# Patient Record
Sex: Male | Born: 1995 | Race: Black or African American | Hispanic: No | Marital: Single | Smoking: Never smoker
Health system: Southern US, Community
[De-identification: ages and names within clinical notes are randomized; demographics above are authoritative.]

---

## 2017-12-08 ENCOUNTER — Emergency Department (HOSPITAL_COMMUNITY)
Admission: EM | Admit: 2017-12-08 | Discharge: 2017-12-09 | Disposition: A | Discharge status: Home / Self Care | Payer: BLUE CROSS/BLUE SHIELD | Attending: Emergency Medicine | Admitting: Emergency Medicine

## 2017-12-08 ENCOUNTER — Encounter (HOSPITAL_COMMUNITY): Payer: Self-pay | Admitting: Emergency Medicine

## 2017-12-08 DIAGNOSIS — K2900 Acute gastritis without bleeding: Secondary | ICD-10-CM | POA: Insufficient documentation

## 2017-12-08 DIAGNOSIS — R112 Nausea with vomiting, unspecified: Secondary | ICD-10-CM | POA: Diagnosis present

## 2017-12-08 LAB — I-STAT CHEM 8, ED
BUN: 15 mg/dL (ref 6–20)
CREATININE: 1.2 mg/dL (ref 0.61–1.24)
Calcium, Ion: 1.14 mmol/L — ABNORMAL LOW (ref 1.15–1.40)
Chloride: 105 mmol/L (ref 98–111)
Glucose, Bld: 75 mg/dL (ref 70–99)
HCT: 45 % (ref 39.0–52.0)
HEMOGLOBIN: 15.3 g/dL (ref 13.0–17.0)
POTASSIUM: 4.3 mmol/L (ref 3.5–5.1)
Sodium: 141 mmol/L (ref 135–145)
TCO2: 25 mmol/L (ref 22–32)

## 2017-12-08 LAB — CBC WITH DIFFERENTIAL/PLATELET
ABS IMMATURE GRANULOCYTES: 0.02 10*3/uL (ref 0.00–0.07)
Basophils Absolute: 0 10*3/uL (ref 0.0–0.1)
Basophils Relative: 0 %
EOS ABS: 0 10*3/uL (ref 0.0–0.5)
Eosinophils Relative: 0 %
HEMATOCRIT: 50.5 % (ref 39.0–52.0)
Hemoglobin: 15.8 g/dL (ref 13.0–17.0)
IMMATURE GRANULOCYTES: 0 %
LYMPHS ABS: 0.7 10*3/uL (ref 0.7–4.0)
Lymphocytes Relative: 7 %
MCH: 29.3 pg (ref 26.0–34.0)
MCHC: 31.3 g/dL (ref 30.0–36.0)
MCV: 93.5 fL (ref 80.0–100.0)
MONOS PCT: 4 %
Monocytes Absolute: 0.5 10*3/uL (ref 0.1–1.0)
NEUTROS PCT: 89 %
Neutro Abs: 10.1 10*3/uL — ABNORMAL HIGH (ref 1.7–7.7)
Platelets: 357 10*3/uL (ref 150–400)
RBC: 5.4 MIL/uL (ref 4.22–5.81)
RDW: 14 % (ref 11.5–15.5)
WBC: 11.3 10*3/uL — ABNORMAL HIGH (ref 4.0–10.5)
nRBC: 0 % (ref 0.0–0.2)

## 2017-12-08 LAB — URINALYSIS, ROUTINE W REFLEX MICROSCOPIC
BILIRUBIN URINE: NEGATIVE
Glucose, UA: NEGATIVE mg/dL
KETONES UR: 80 mg/dL — AB
LEUKOCYTES UA: NEGATIVE
Nitrite: NEGATIVE
PH: 5 (ref 5.0–8.0)
PROTEIN: NEGATIVE mg/dL
Specific Gravity, Urine: 1.024 (ref 1.005–1.030)

## 2017-12-08 LAB — COMPREHENSIVE METABOLIC PANEL
ALK PHOS: 95 U/L (ref 38–126)
ALT: 36 U/L (ref 0–44)
AST: 49 U/L — AB (ref 15–41)
Albumin: 5 g/dL (ref 3.5–5.0)
Anion gap: 13 (ref 5–15)
BILIRUBIN TOTAL: 1.6 mg/dL — AB (ref 0.3–1.2)
BUN: 14 mg/dL (ref 6–20)
CHLORIDE: 105 mmol/L (ref 98–111)
CO2: 21 mmol/L — AB (ref 22–32)
Calcium: 10.1 mg/dL (ref 8.9–10.3)
Creatinine, Ser: 1.27 mg/dL — ABNORMAL HIGH (ref 0.61–1.24)
GFR calc Af Amer: >60 mL/min (ref >60)
GFR calc non Af Amer: >60 mL/min (ref >60)
GLUCOSE: 77 mg/dL (ref 70–99)
Potassium: 5.2 mmol/L — ABNORMAL HIGH (ref 3.5–5.1)
Sodium: 139 mmol/L (ref 135–145)
TOTAL PROTEIN: 8.1 g/dL (ref 6.5–8.1)

## 2017-12-08 MED ORDER — ALUM & MAG HYDROXIDE-SIMETH 200-200-20 MG/5ML PO SUSP
30.0000 mL | Freq: Once | ORAL | Status: AC
  Administered 2017-12-08: 30 mL via ORAL
  Filled 2017-12-08: qty 30

## 2017-12-08 MED ORDER — SODIUM CHLORIDE 0.9 % IV SOLN
INTRAVENOUS | Status: AC
  Administered 2017-12-08: 17:00:00 via INTRAVENOUS

## 2017-12-08 MED ORDER — SODIUM CHLORIDE 0.9 % IV BOLUS
1000.0000 mL | Freq: Once | INTRAVENOUS | Status: AC
  Administered 2017-12-08: 1000 mL via INTRAVENOUS

## 2017-12-08 MED ORDER — ONDANSETRON HCL 4 MG/2ML IJ SOLN
4.0000 mg | Freq: Once | INTRAMUSCULAR | Status: AC
  Administered 2017-12-08: 4 mg via INTRAVENOUS
  Filled 2017-12-08: qty 2

## 2017-12-08 MED ORDER — LIDOCAINE VISCOUS HCL 2 % MT SOLN
15.0000 mL | Freq: Once | OROMUCOSAL | Status: AC
  Administered 2017-12-08: 15 mL via ORAL
  Filled 2017-12-08: qty 15

## 2017-12-08 NOTE — ED Provider Notes (Signed)
Patient placed in Quick Look pathway, seen and evaluated   Chief Complaint: nausea and vomiting, weak  HPI: Dustin Sweeney is a 22 y.o. male who presents to the ED with n/v that started this morning. Patient reports vomiting more than 20 times today. He c/o feeling weak.   ROS: General: weakness  GI: n/v  Physical Exam:  BP (!) 142/125   Pulse 67   Temp 97.6 F (36.4 C) (Oral)   Resp 16   SpO2 100%    Gen: No distress  Neuro: Awake and Alert  Skin: Warm and dry   Initiation of care has begun. The patient has been counseled on the process, plan, and necessity for staying for the completion/evaluation, and the remainder of the medical screening examination    Janne Napoleon, NP 12/08/17 1657    Raeford Razor, MD 12/09/17 [phone]

## 2017-12-08 NOTE — ED Triage Notes (Signed)
Pt presents to ED for assessment of nausea, emesis and abdominal discomfort since this morning.  States the last thing he at was [employer] last night.  Denies known exposure

## 2017-12-08 NOTE — ED Provider Notes (Signed)
[facility] EMERGENCY DEPARTMENT Provider Note   CSN: [id number] Arrival date & time: 12/08/17  1640     History   Chief Complaint Chief Complaint  Patient presents with  . Nausea  . Emesis    HPI Lavonte Palos is a 22 y.o. male.  Patient is a 22 year old male with no significant past medical history who presents with nausea and vomiting.  He states he drank a fair amount of alcohol last night and then ate a 12 count chicken nuggets from [employer].  This morning he had onset of nausea and vomiting.  Initially thought it was just a hangover but continued to vomit throughout the day.  He has some generalized abdominal soreness.  No fevers.  No diarrhea.  No hematemesis.  Since he was seen in triage, he has been given 1 L IV fluid and Zofran IV.  He states he is feeling much better and request something to drink.     History reviewed. No pertinent past medical history.  There are no active problems to display for this patient.   History reviewed. No pertinent surgical history.      Home Medications    Prior to Admission medications   Medication Sig Start Date End Date Taking? Authorizing Provider  ondansetron (ZOFRAN ODT) 4 MG disintegrating tablet 4mg  ODT q4 hours prn nausea/vomit 12/09/17   Rolan Bucco, MD    Family History History reviewed. No pertinent family history.  Social History Social History   Tobacco Use  . Smoking status: Never Smoker  . Smokeless tobacco: Never Used  Substance Use Topics  . Alcohol use: Not Currently  . Drug use: Never     Allergies   Patient has no allergy information on record.   Review of Systems Review of Systems  Constitutional: Positive for fatigue. Negative for chills, diaphoresis and fever.  HENT: Negative for congestion, rhinorrhea and sneezing.   Eyes: Negative.   Respiratory: Negative for cough, chest tightness and shortness of breath.   Cardiovascular: Negative for chest pain and leg  swelling.  Gastrointestinal: Positive for nausea and vomiting. Negative for abdominal pain, blood in stool and diarrhea.  Genitourinary: Negative for difficulty urinating, flank pain, frequency and hematuria.  Musculoskeletal: Negative for arthralgias and back pain.  Skin: Negative for rash.  Neurological: Negative for dizziness, speech difficulty, weakness, numbness and headaches.     Physical Exam Updated Vital Signs BP (!) 125/52 (BP Location: Right Arm)   Pulse 60   Temp 97.6 F (36.4 C) (Oral)   Resp 16   SpO2 98%   Physical Exam  Constitutional: He is oriented to person, place, and time. He appears well-developed and well-nourished.  HENT:  Head: Normocephalic and atraumatic.  Eyes: Pupils are equal, round, and reactive to light.  Neck: Normal range of motion. Neck supple.  Cardiovascular: Normal rate, regular rhythm and normal heart sounds.  Pulmonary/Chest: Effort normal and breath sounds normal. No respiratory distress. He has no wheezes. He has no rales. He exhibits no tenderness.  Abdominal: Soft. Bowel sounds are normal. There is no tenderness. There is no rebound and no guarding.  Musculoskeletal: Normal range of motion. He exhibits no edema.  Lymphadenopathy:    He has no cervical adenopathy.  Neurological: He is alert and oriented to person, place, and time.  Skin: Skin is warm and dry. No rash noted.  Psychiatric: He has a normal mood and affect.     ED Treatments / Results  Labs (all labs ordered are  listed, but only abnormal results are displayed) Labs Reviewed  CBC WITH DIFFERENTIAL/PLATELET - Abnormal; Notable for the following components:      Result Value   WBC 11.3 (*)    Neutro Abs 10.1 (*)    All other components within normal limits  COMPREHENSIVE METABOLIC PANEL - Abnormal; Notable for the following components:   Potassium 5.2 (*)    CO2 21 (*)    Creatinine, Ser 1.27 (*)    AST 49 (*)    Total Bilirubin 1.6 (*)    All other components  within normal limits  URINALYSIS, ROUTINE W REFLEX MICROSCOPIC - Abnormal; Notable for the following components:   Hgb urine dipstick SMALL (*)    Ketones, ur 80 (*)    Bacteria, UA RARE (*)    All other components within normal limits  I-STAT CHEM 8, ED - Abnormal; Notable for the following components:   Calcium, Ion 1.14 (*)    All other components within normal limits    EKG None  Radiology No results found.  Procedures Procedures (including critical care time)  Medications Ordered in ED Medications  0.9 %  sodium chloride infusion ( Intravenous Stopped 12/08/17 1837)  sodium chloride 0.9 % bolus 1,000 mL (1,000 mLs Intravenous New Bag/Given 12/08/17 2345)  ondansetron (ZOFRAN) injection 4 mg (4 mg Intravenous Given 12/08/17 1511)  alum & mag hydroxide-simeth (MAALOX/MYLANTA) 200-200-20 MG/5ML suspension 30 mL (30 mLs Oral Given 12/08/17 2341)    And  lidocaine (XYLOCAINE) 2 % viscous mouth solution 15 mL (15 mLs Oral Given 12/08/17 2341)     Initial Impression / Assessment and Plan / ED Course  I have reviewed the triage vital signs and the nursing notes.  Pertinent labs & imaging results that were available during my care of the patient were reviewed by me and considered in my medical decision making (see chart for details).     Patient is a 22 year old male who presents with nausea and vomiting.  His abdominal exam is completely benign.  He was given IV fluids and antiemetics.  He is feeling much better and is tolerating oral fluids without any nausea or vomiting.  His labs are non-concerning.  His initial labs showed some signs of dehydration with an elevated creatinine and mildly elevated potassium.  Recheck on this showed that his labs have normalized.  This was after IV fluids.  He did complain of a sore throat which I feel is likely from the vomiting.  This resolved after GI cocktail.  He was discharged home in good condition.  Return precautions were given.  Final  Clinical Impressions(s) / ED Diagnoses   Final diagnoses:  Acute gastritis without hemorrhage, unspecified gastritis type  Non-intractable vomiting with nausea, unspecified vomiting type    ED Discharge Orders         Ordered    ondansetron (ZOFRAN ODT) 4 MG disintegrating tablet     12/09/17 0015           Rolan Bucco, MD 12/09/17 6962

## 2017-12-09 MED ORDER — ONDANSETRON 4 MG PO TBDP: ORAL_TABLET | ORAL | 0 refills | Status: AC

## 2018-10-01 ENCOUNTER — Other Ambulatory Visit: Payer: Self-pay

## 2018-10-01 DIAGNOSIS — Z20822 Contact with and (suspected) exposure to covid-19: Secondary | ICD-10-CM

## 2018-10-02 LAB — NOVEL CORONAVIRUS, NAA: SARS-CoV-2, NAA: NOT DETECTED

## 2018-10-02 LAB — SPECIMEN STATUS REPORT

## 2018-11-08 ENCOUNTER — Encounter (HOSPITAL_COMMUNITY): Payer: Self-pay | Admitting: Emergency Medicine

## 2018-11-08 ENCOUNTER — Emergency Department (HOSPITAL_COMMUNITY): Payer: Self-pay

## 2018-11-08 ENCOUNTER — Emergency Department (HOSPITAL_COMMUNITY)
Admission: EM | Admit: 2018-11-08 | Discharge: 2018-11-08 | Disposition: A | Discharge status: Home / Self Care | Payer: Self-pay | Attending: Emergency Medicine | Admitting: Emergency Medicine

## 2018-11-08 DIAGNOSIS — Y9302 Activity, running: Secondary | ICD-10-CM | POA: Insufficient documentation

## 2018-11-08 DIAGNOSIS — W540XXA Bitten by dog, initial encounter: Secondary | ICD-10-CM | POA: Insufficient documentation

## 2018-11-08 DIAGNOSIS — Y999 Unspecified external cause status: Secondary | ICD-10-CM | POA: Insufficient documentation

## 2018-11-08 DIAGNOSIS — S21132A Puncture wound without foreign body of left front wall of thorax without penetration into thoracic cavity, initial encounter: Secondary | ICD-10-CM | POA: Insufficient documentation

## 2018-11-08 DIAGNOSIS — Y929 Unspecified place or not applicable: Secondary | ICD-10-CM | POA: Insufficient documentation

## 2018-11-08 DIAGNOSIS — Z23 Encounter for immunization: Secondary | ICD-10-CM | POA: Insufficient documentation

## 2018-11-08 DIAGNOSIS — S81831A Puncture wound without foreign body, right lower leg, initial encounter: Secondary | ICD-10-CM | POA: Insufficient documentation

## 2018-11-08 MED ORDER — AMOXICILLIN-POT CLAVULANATE 875-125 MG PO TABS
1.0000 | ORAL_TABLET | Freq: Once | ORAL | Status: AC
  Administered 2018-11-08: 1 via ORAL
  Filled 2018-11-08: qty 1

## 2018-11-08 MED ORDER — TETANUS-DIPHTH-ACELL PERTUSSIS 5-2.5-18.5 LF-MCG/0.5 IM SUSP
0.5000 mL | Freq: Once | INTRAMUSCULAR | Status: AC
  Administered 2018-11-08: 17:00:00 0.5 mL via INTRAMUSCULAR
  Filled 2018-11-08: qty 0.5

## 2018-11-08 MED ORDER — AMOXICILLIN-POT CLAVULANATE 875-125 MG PO TABS: 1.0000 | ORAL_TABLET | Freq: Two times a day (BID) | ORAL | 0 refills | Status: AC

## 2018-11-08 NOTE — ED Notes (Signed)
Patient transported to X-ray 

## 2018-11-08 NOTE — ED Triage Notes (Signed)
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 Pt has puncture wound to right calf and abrasions to left rib area with hematoma.

## 2018-11-08 NOTE — ED Provider Notes (Signed)
[facility] EMERGENCY DEPARTMENT Provider Note   CSN: [id number] Arrival date & time: 11/08/18  1403     History   Chief Complaint Chief Complaint  Patient presents with  . Animal Bite    HPI Dustin Sweeney is a 23 y.o. male who is previously healthy who presents with dog bite to his right calf and left rib cage.  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  He first bit him in the left rib cage and then on the right shin.  He reports he washed the wounds with peroxide and applied bacitracin immediately after.  The owner of the dog stated that he is up-to-date on all his shots.  Patient does not believe he is up-to-date on his tetanus.  He denies any chest pain or shortness of breath, numbness tingling, or any other injuries.     HPI  History reviewed. No pertinent past medical history.  There are no active problems to display for this patient.   No past surgical history on file.      Home Medications    Prior to Admission medications   Medication Sig Start Date End Date Taking? Authorizing Provider  amoxicillin-clavulanate (AUGMENTIN) 875-125 MG tablet Take 1 tablet by mouth every 12 (twelve) hours. 11/08/18   Emi Holes, PA-C  ondansetron (ZOFRAN ODT) 4 MG disintegrating tablet 4mg  ODT q4 hours prn nausea/vomit 12/09/17   12/11/17, MD    Family History No family history on file.  Social History Social History   Tobacco Use  . Smoking status: Never Smoker  . Smokeless tobacco: Never Used  Substance Use Topics  . Alcohol use: Not Currently  . Drug use: Never     Allergies   Patient has no known allergies.   Review of Systems Review of Systems  Skin: Positive for wound.  Neurological: Negative for numbness.     Physical Exam Updated Vital Signs BP 136/80   Pulse 75   Temp 97.8 F (36.6 C) (Oral)   Resp 16   SpO2 99%   Physical Exam  Vitals signs and nursing note reviewed.  Constitutional:      General: He is not in acute distress.    Appearance: He is well-developed. He is not diaphoretic.  HENT:     Head: Normocephalic and atraumatic.     Mouth/Throat:     Pharynx: No oropharyngeal exudate.  Eyes:     General: No scleral icterus.       Right eye: No discharge.        Left eye: No discharge.     Conjunctiva/sclera: Conjunctivae normal.     Pupils: Pupils are equal, round, and reactive to light.  Neck:     Musculoskeletal: Normal range of motion and neck supple.     Thyroid: No thyromegaly.  Cardiovascular:     Rate and Rhythm: Normal rate and regular rhythm.     Heart sounds: Normal heart sounds. No murmur. No friction rub. No gallop.   Pulmonary:     Effort: Pulmonary effort is normal. No respiratory distress.     Breath sounds: Normal breath sounds. No stridor. No wheezing or rales.  Abdominal:     General: Bowel sounds are normal. There is no distension.     Palpations: Abdomen is soft.     Tenderness: There is no abdominal tenderness. There is no guarding or rebound.  Musculoskeletal:       Legs:  Lymphadenopathy:     Cervical: No cervical adenopathy.  Skin:    General: Skin is warm and dry.     Coloration: Skin is not pale.     Findings: No rash.  Neurological:     Mental Status: He is alert.     Coordination: Coordination normal.      ED Treatments / Results  Labs (all labs ordered are listed, but only abnormal results are displayed) Labs Reviewed - No data to display  EKG None  Radiology Dg Ribs Unilateral W/chest Left  Result Date: 11/08/2018 CLINICAL DATA:  Pain following dog bite EXAM: LEFT RIBS AND CHEST - 3+ VIEW COMPARISON:  None. FINDINGS: Frontal chest as well as oblique and cone-down rib images were obtained. Lungs are clear. Heart size and pulmonary vascularity are normal. No adenopathy. No evident pneumothorax or pleural effusion. No evident fracture. No radiopaque  foreign body. IMPRESSION: No evident fracture. No radiopaque foreign body. Lungs clear. No pneumothorax. Electronically Signed   By: Lowella Grip III M.D.   On: 11/08/2018 16:41   Dg Tibia/fibula Right  Result Date: 11/08/2018 CLINICAL DATA:  Status post dog bite to the right lower leg today. Initial encounter. EXAM: RIGHT TIBIA AND FIBULA - 2 VIEW COMPARISON:  None. FINDINGS: Soft tissue gas is seen in the superficial aspect of the medial aspect of the proximal lower leg. No radiopaque foreign body. No bony or joint abnormality. IMPRESSION: Gas in the medial soft tissues of the left lower leg consistent with history of dog bite. Negative for foreign body or bony abnormality. Electronically Signed   By: Inge Rise M.D.   On: 11/08/2018 16:40    Procedures Procedures (including critical care time)  Medications Ordered in ED Medications  Tdap (BOOSTRIX) injection 0.5 mL (0.5 mLs Intramuscular Given 11/08/18 1642)  amoxicillin-clavulanate (AUGMENTIN) 875-125 MG per tablet 1 tablet (1 tablet Oral Given 11/08/18 1642)     Initial Impression / Assessment and Plan / ED Course  I have reviewed the triage vital signs and the nursing notes.  Pertinent labs & imaging results that were available during my care of the patient were reviewed by me and considered in my medical decision making (see chart for details).        Patient with dog bite to right calf and left rib cage.  X-rays are negative for fracture.  There is no pneumothorax on chest x-ray.  Wounds irrigated with normal saline.  Augmentin given.  Tetanus updated.  Will discharge home with Augmentin.  Return precautions discussed for signs of infection.  Patient understands and agrees with plan.  Patient vitals stable throughout ED course and discharged in satisfactory condition.  Final Clinical Impressions(s) / ED Diagnoses   Final diagnoses:  Dog bite, initial encounter    ED Discharge Orders         Ordered     amoxicillin-clavulanate (AUGMENTIN) 875-125 MG tablet  Every 12 hours     11/08/18 [address], [place], PA-C 11/08/18 1709    Louanne Skye, MD 11/09/18 2224

## 2018-11-08 NOTE — Discharge Instructions (Signed)
Take Augmentin as prescribed until completed.  For your pain, you can take ibuprofen or Tylenol as prescribed over-the-counter.  Wash the wounds daily with warm soapy water.  Please return if you develop any increasing pain, redness, swelling, drainage, red streaking from the wound, or fevers.

## 2021-01-02 IMAGING — DX DG RIBS W/ CHEST 3+V*L*
5 series · 5 of 5 positions shown · non-contrast
Comparison: None.

CLINICAL DATA: Pain following dog bite

EXAM:
LEFT RIBS AND CHEST - 3+ VIEW

[chest pa]
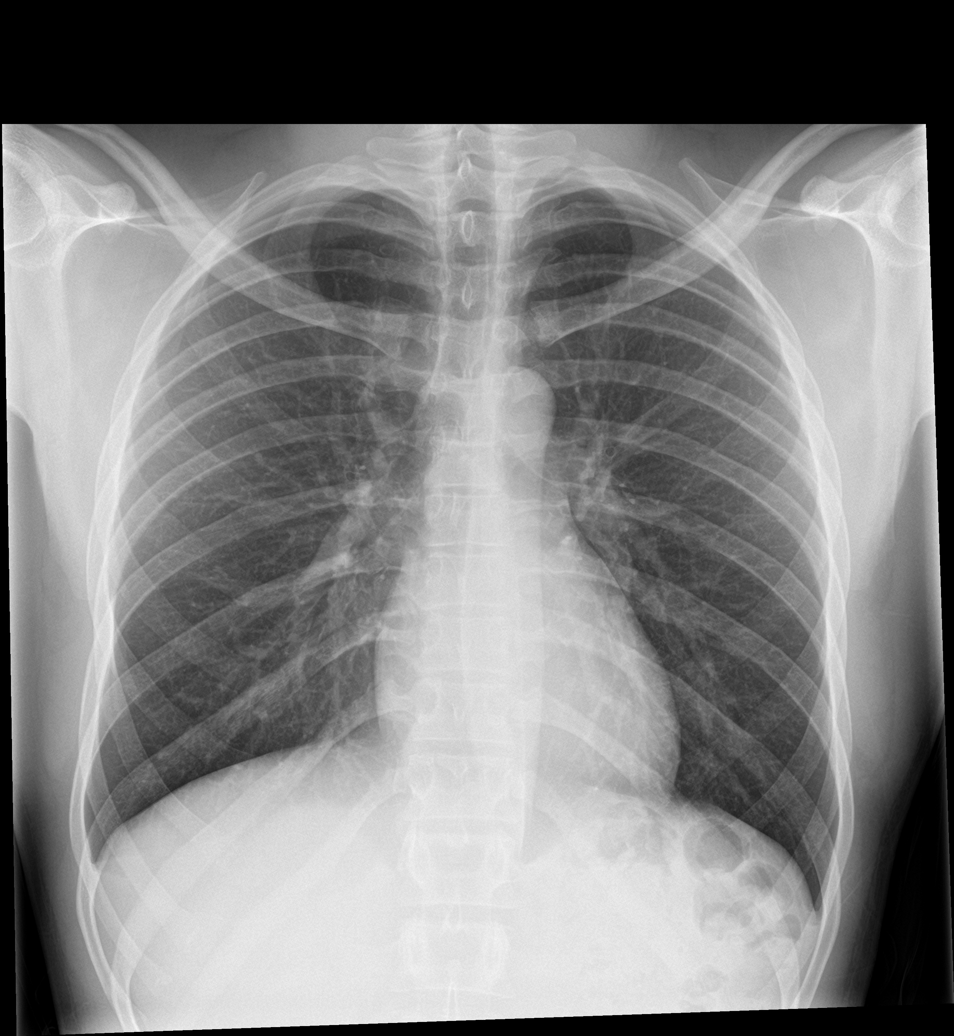

[rib pa obl (1 of 2)]
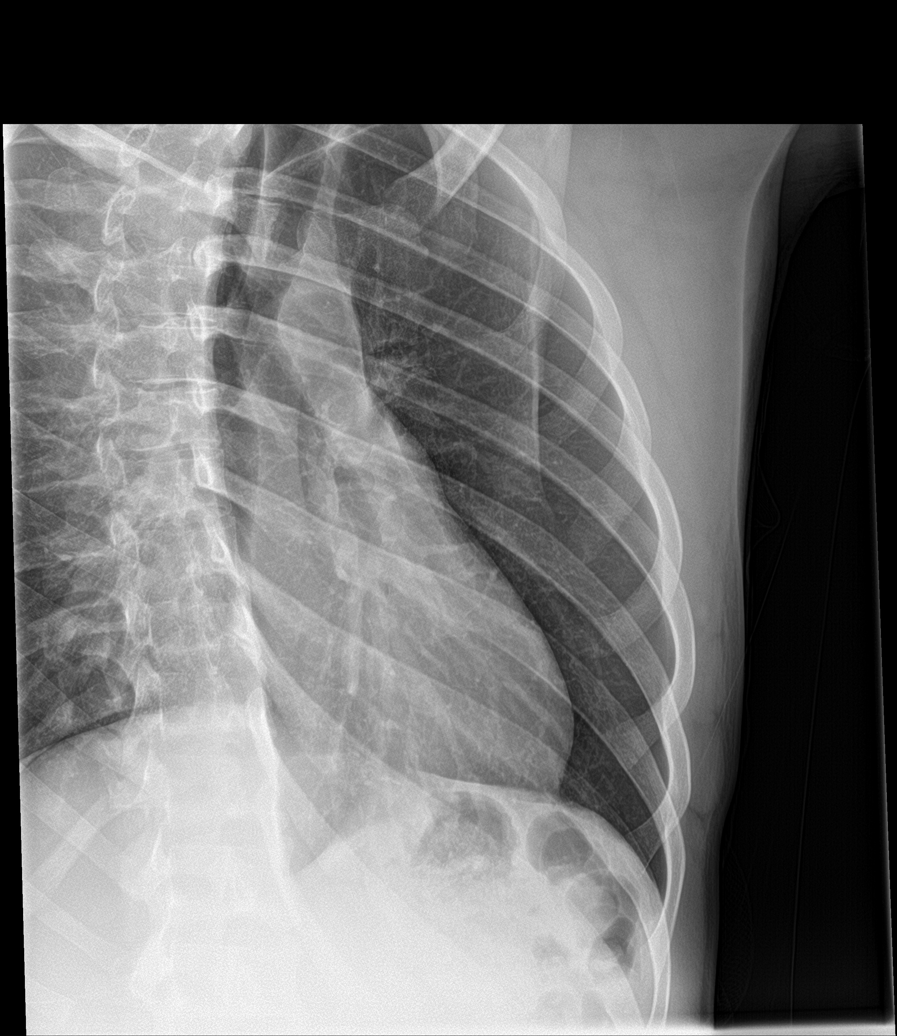

[rib pa (1 of 2)]
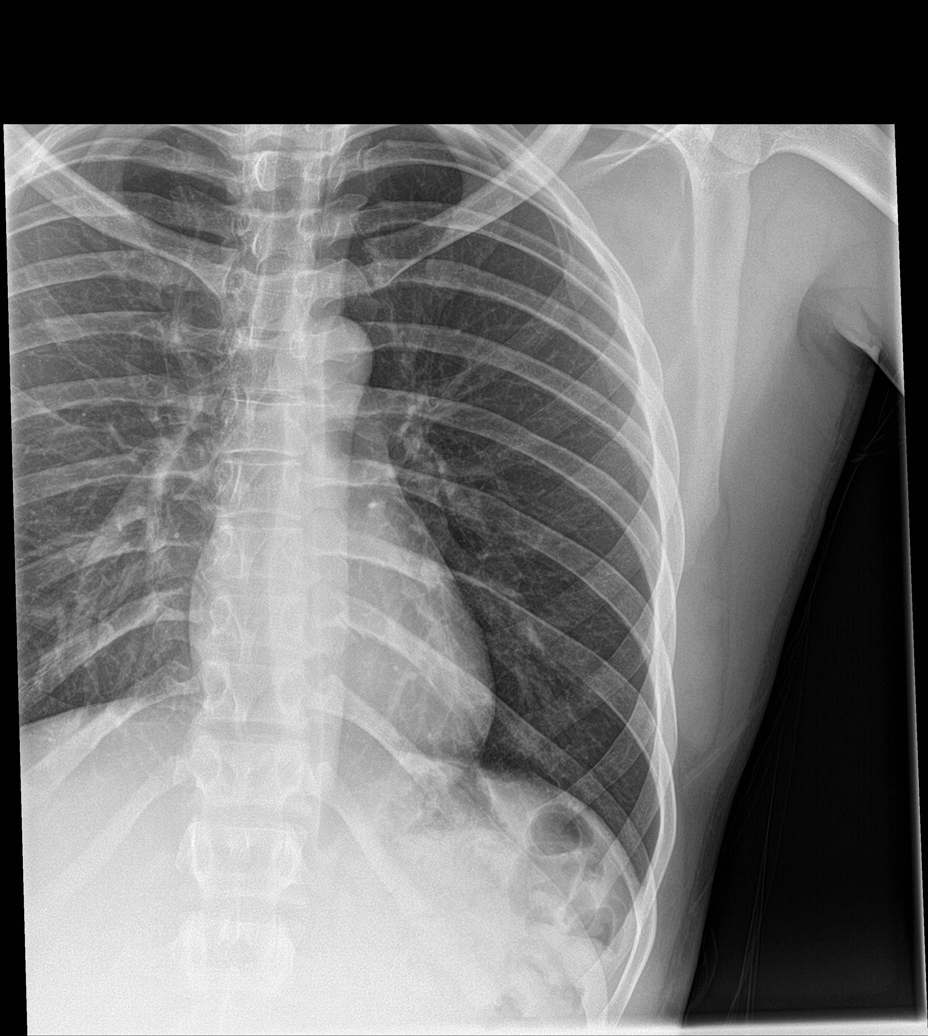

[rib pa (2 of 2)]
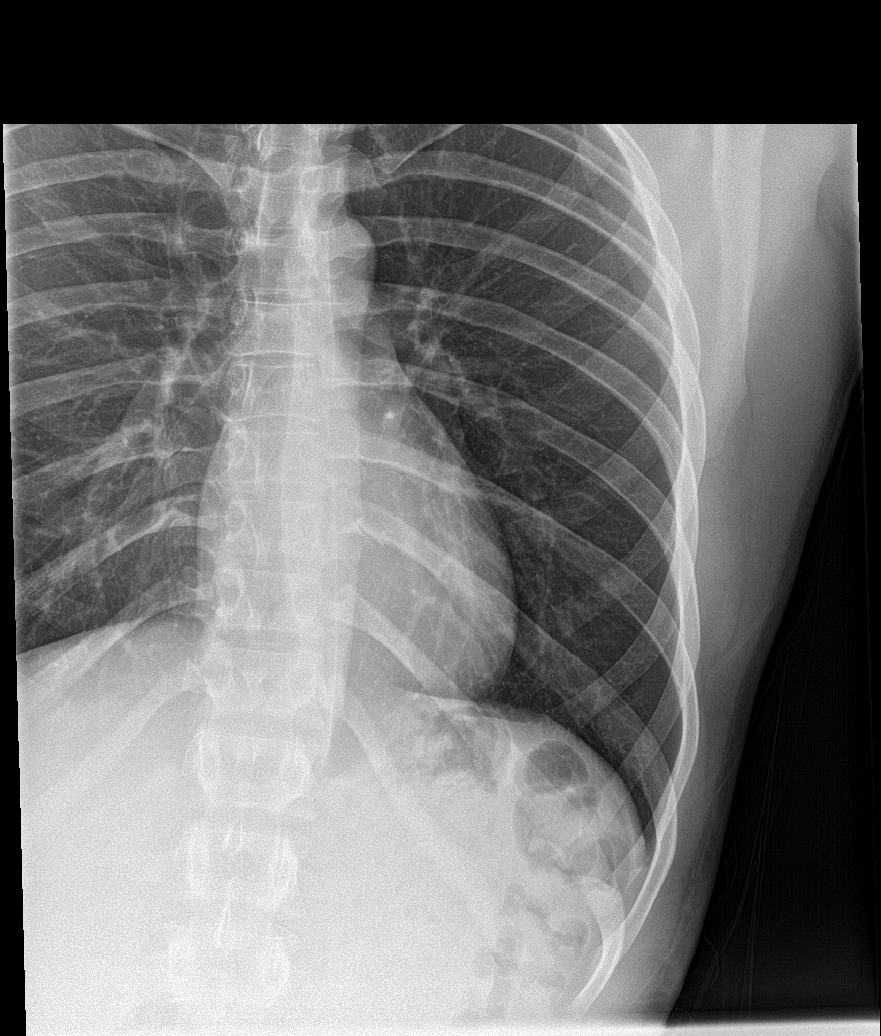

[rib pa obl (2 of 2)]
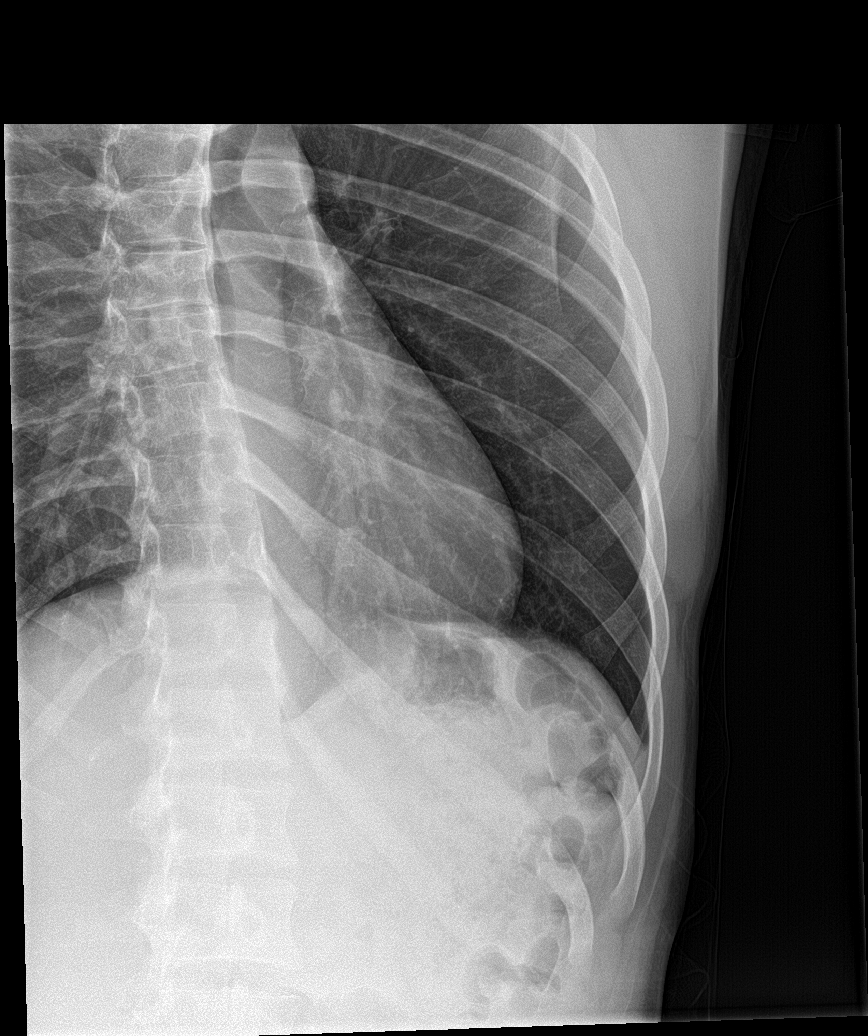

[5 of 5 positions shown; findings below may reference images not displayed]

FINDINGS: Frontal chest as well as oblique and cone-down rib images were
obtained. Lungs are clear. Heart size and pulmonary vascularity are
normal. No adenopathy.

No evident pneumothorax or pleural effusion. No evident fracture. No
radiopaque foreign body.
IMPRESSION: No evident fracture. No radiopaque foreign body. Lungs clear. No
pneumothorax.
